# Patient Record
Sex: Male | Born: 1945 | Race: White | Hispanic: No | Marital: Married | Smoking: Never smoker
Health system: Southern US, Community
[De-identification: ages and names within clinical notes are randomized; demographics above are authoritative.]

## PROBLEM LIST (undated history)

## (undated) DIAGNOSIS — I10 Essential (primary) hypertension: Secondary | ICD-10-CM

## (undated) DIAGNOSIS — N289 Disorder of kidney and ureter, unspecified: Secondary | ICD-10-CM

## (undated) DIAGNOSIS — E119 Type 2 diabetes mellitus without complications: Secondary | ICD-10-CM

## (undated) HISTORY — PX: NECK SURGERY: SHX720

## (undated) HISTORY — PX: KNEE SURGERY: SHX244

## (undated) HISTORY — PX: HERNIA REPAIR: SHX51

---

## 2016-10-21 ENCOUNTER — Encounter (HOSPITAL_COMMUNITY): Payer: Self-pay | Admitting: Emergency Medicine

## 2016-10-21 ENCOUNTER — Emergency Department (HOSPITAL_COMMUNITY): Payer: Managed Care, Other (non HMO)

## 2016-10-21 ENCOUNTER — Emergency Department (HOSPITAL_COMMUNITY)
Admission: EM | Admit: 2016-10-21 | Discharge: 2016-10-21 | Disposition: A | Discharge status: Home / Self Care | Payer: Managed Care, Other (non HMO) | Attending: Emergency Medicine | Admitting: Emergency Medicine

## 2016-10-21 DIAGNOSIS — E119 Type 2 diabetes mellitus without complications: Secondary | ICD-10-CM | POA: Diagnosis not present

## 2016-10-21 DIAGNOSIS — Z7984 Long term (current) use of oral hypoglycemic drugs: Secondary | ICD-10-CM | POA: Insufficient documentation

## 2016-10-21 DIAGNOSIS — N200 Calculus of kidney: Secondary | ICD-10-CM

## 2016-10-21 DIAGNOSIS — I10 Essential (primary) hypertension: Secondary | ICD-10-CM | POA: Insufficient documentation

## 2016-10-21 DIAGNOSIS — R109 Unspecified abdominal pain: Secondary | ICD-10-CM | POA: Diagnosis present

## 2016-10-21 HISTORY — DX: Type 2 diabetes mellitus without complications: E11.9

## 2016-10-21 HISTORY — DX: Essential (primary) hypertension: I10

## 2016-10-21 HISTORY — DX: Disorder of kidney and ureter, unspecified: N28.9

## 2016-10-21 LAB — URINALYSIS, ROUTINE W REFLEX MICROSCOPIC
BILIRUBIN URINE: NEGATIVE
Ketones, ur: 20 mg/dL — AB
Leukocytes, UA: NEGATIVE
Nitrite: NEGATIVE
PH: 5 (ref 5.0–8.0)
Protein, ur: 100 mg/dL — AB
SPECIFIC GRAVITY, URINE: 1.021 (ref 1.005–1.030)

## 2016-10-21 LAB — COMPREHENSIVE METABOLIC PANEL
ALK PHOS: 67 U/L (ref 38–126)
ALT: 60 U/L (ref 17–63)
ANION GAP: 18 — AB (ref 5–15)
AST: 57 U/L — ABNORMAL HIGH (ref 15–41)
Albumin: 5 g/dL (ref 3.5–5.0)
BUN: 17 mg/dL (ref 6–20)
CALCIUM: 9.5 mg/dL (ref 8.9–10.3)
CO2: 19 mmol/L — AB (ref 22–32)
Chloride: 96 mmol/L — ABNORMAL LOW (ref 101–111)
Creatinine, Ser: 1.32 mg/dL — ABNORMAL HIGH (ref 0.61–1.24)
GFR calc Af Amer: >60 mL/min (ref >60)
GFR calc non Af Amer: 53 mL/min — ABNORMAL LOW (ref >60)
GLUCOSE: 281 mg/dL — AB (ref 65–99)
Potassium: 4.6 mmol/L (ref 3.5–5.1)
SODIUM: 133 mmol/L — AB (ref 135–145)
Total Bilirubin: 1.1 mg/dL (ref 0.3–1.2)
Total Protein: 8.5 g/dL — ABNORMAL HIGH (ref 6.5–8.1)

## 2016-10-21 LAB — CBC
HCT: 48.1 % (ref 39.0–52.0)
HEMOGLOBIN: 16.8 g/dL (ref 13.0–17.0)
MCH: 32.9 pg (ref 26.0–34.0)
MCHC: 34.9 g/dL (ref 30.0–36.0)
MCV: 94.1 fL (ref 78.0–100.0)
Platelets: 234 10*3/uL (ref 150–400)
RBC: 5.11 MIL/uL (ref 4.22–5.81)
RDW: 14 % (ref 11.5–15.5)
WBC: 13 10*3/uL — ABNORMAL HIGH (ref 4.0–10.5)

## 2016-10-21 LAB — LIPASE, BLOOD: Lipase: 18 U/L (ref 11–51)

## 2016-10-21 MED ORDER — ONDANSETRON HCL 4 MG/2ML IJ SOLN
4.0000 mg | Freq: Once | INTRAMUSCULAR | Status: AC
  Administered 2016-10-21: 4 mg via INTRAVENOUS
  Filled 2016-10-21: qty 2

## 2016-10-21 MED ORDER — OXYCODONE-ACETAMINOPHEN 5-325 MG PO TABS: 2.0000 | ORAL_TABLET | ORAL | 0 refills | Status: AC | PRN

## 2016-10-21 MED ORDER — KETOROLAC TROMETHAMINE 30 MG/ML IJ SOLN
30.0000 mg | Freq: Once | INTRAMUSCULAR | Status: AC
  Administered 2016-10-21: 30 mg via INTRAVENOUS
  Filled 2016-10-21: qty 1

## 2016-10-21 MED ORDER — ONDANSETRON HCL 4 MG PO TABS: 4.0000 mg | ORAL_TABLET | Freq: Four times a day (QID) | ORAL | 0 refills | Status: AC

## 2016-10-21 MED ORDER — TAMSULOSIN HCL 0.4 MG PO CAPS: 0.4000 mg | ORAL_CAPSULE | Freq: Every day | ORAL | 0 refills | Status: AC

## 2016-10-21 MED ORDER — SODIUM CHLORIDE 0.9 % IV BOLUS (SEPSIS)
1000.0000 mL | Freq: Once | INTRAVENOUS | Status: AC
  Administered 2016-10-21: 1000 mL via INTRAVENOUS

## 2016-10-21 MED ORDER — HYDROMORPHONE HCL 1 MG/ML IJ SOLN
1.0000 mg | Freq: Once | INTRAMUSCULAR | Status: AC
  Administered 2016-10-21: 1 mg via INTRAVENOUS
  Filled 2016-10-21: qty 1

## 2016-10-21 NOTE — Discharge Instructions (Signed)
Please take 1 capsule of Flomax by mouth 30 minutes after same meal every day. Please take 2 tablets of Percocet every 4-6 hours as needed for pain. Take one tablet of Zofran every 6 hours as needed for nausea and vomiting. Take plenty of fluids and referred to attached sheet or dietary recommendations.  Get help right away if: You have a fever or chills. You develop severe pain. You develop new abdominal pain. You faint. You are unable to urinate.

## 2016-10-21 NOTE — ED Notes (Signed)
Pt cannot use restroom at this time, aware urine specimen is needed.  

## 2016-10-21 NOTE — ED Provider Notes (Signed)
Medical screening examination/treatment/procedure(s) were conducted as a shared visit with non-physician practitioner(s) and myself.  I personally evaluated the patient during the encounter.   EKG Interpretation None     70 year old male here with left-sided flank pain that began several hours prior to arrival. Patient had abdominal CT scan which was consistent with kidney stone. Was medicated him feel better. He'll follow-up with his neurologist back home   Lorre NickAnthony Ahmari Duerson, MD 10/21/16 1322

## 2016-10-21 NOTE — ED Provider Notes (Signed)
[facility] DEPT Provider Note   CSN: [id number] Arrival date & time: 10/21/16  1040     History   Chief Complaint Chief Complaint  Patient presents with  . Flank Pain    left    HPI Benjamin Spencer is a 70 y.o. male with history of diabetes, hypertension and 1 episode of kidney stone presents to the ED complaining of left flank pain that radiates to starting this morning at 2 AM. She characterizes her pain as rapid onset, sharp, continuous, 10/10 pain. He reports not taking any pain medication since pain started. He states nothing makes it better. He reports to feeling like the previous kidney stone he had several years ago. He reports nausea and 5 episodes of vomiting since started. He denies hematemesis. He states he hasn't had anything to eat or drink since pain started. He denies chest pain, shortness of breath, dysuria, any changes in urinary symptoms, changes in bowel movements, fever, or chills.   HPI  Past Medical History:  Diagnosis Date  . Diabetes mellitus without complication ([place])   . Hypertension   . Renal disorder    kidney stones    There are no active problems to display for this patient.   Past Surgical History:  Procedure Laterality Date  . HERNIA REPAIR    . KNEE SURGERY     Right  . NECK SURGERY         Home Medications    Prior to Admission medications   Medication Sig Start Date End Date Taking? Authorizing Provider  amLODipine (NORVASC) 10 MG tablet Take 10 mg by mouth daily.   Yes Historical Provider, MD  Cholecalciferol (VITAMIN D3) 5000 units TABS 5000 units by mouth daily 05/30/16  Yes Historical Provider, MD  econazole nitrate 1 % cream APPLY TO AFFECTED AREA EVERY DAY FOR 1 MONTH, THEN AS NEEDED 10/06/16  Yes Historical Provider, MD  gabapentin (NEURONTIN) 300 MG capsule  10/06/16  Yes Historical Provider, MD  ibuprofen (ADVIL,MOTRIN) 800 MG tablet Take 800 mg by mouth 2 (two) times daily as needed for mild pain.    Yes Historical  Provider, MD  losartan (COZAAR) 100 MG tablet Take 100 mg by mouth daily.   Yes Historical Provider, MD  metFORMIN (GLUCOPHAGE) 500 MG tablet Take 500 mg by mouth 2 (two) times daily with a meal.   Yes Historical Provider, MD  Multiple Vitamin (MULTIVITAMIN WITH MINERALS) TABS tablet Take 1 tablet by mouth daily.   Yes Historical Provider, MD  omeprazole (PRILOSEC) 20 MG capsule Take 20 mg by mouth daily.   Yes Historical Provider, MD  pravastatin (PRAVACHOL) 40 MG tablet Take 40 mg by mouth daily.   Yes Historical Provider, MD  Testosterone Cypionate 200 MG/ML KIT Inject 200 mg into the muscle every 14 (fourteen) days.   Yes Historical Provider, MD  ondansetron (ZOFRAN) 4 MG tablet Take 1 tablet (4 mg total) by mouth every 6 (six) hours. 10/21/16   Quandra Fedorchak [place], [place]  oxyCODONE-acetaminophen (PERCOCET/ROXICET) 5-325 MG tablet Take 2 tablets by mouth every 4 (four) hours as needed for severe pain. 10/21/16   Saleema Weppler Manuel [place], [place]  tamsulosin (FLOMAX) 0.4 MG CAPS capsule Take 1 capsule (0.4 mg total) by mouth daily after breakfast. 10/21/16   Bettey [place], [place]    Family History No family history on file.  Social History Social History  Substance Use Topics  . Smoking status: Never Smoker  . Smokeless tobacco: Never Used  . Alcohol use No  Allergies   Percocet [oxycodone-acetaminophen]   Review of Systems Review of Systems  Constitutional: Positive for appetite change. Negative for chills and fever.  HENT: Negative for sore throat.   Eyes: Negative for visual disturbance.  Respiratory: Negative for cough and shortness of breath.   Cardiovascular: Negative for chest pain and palpitations.  Gastrointestinal: Positive for abdominal pain, nausea and vomiting. Negative for constipation and diarrhea.  Genitourinary: Positive for flank pain. Negative for dysuria and hematuria.  Musculoskeletal: Positive for back pain. Negative for arthralgias.  Skin: Negative for  color change and rash.  All other systems reviewed and are negative.    Physical Exam Updated Vital Signs BP 149/87   Pulse 104   Temp 97.5 F (36.4 C) (Oral)   Resp 18   Ht _0  (1.702 m)   Wt 114.3 kg   SpO2 94%   BMI 39.47 kg/m   Physical Exam  Constitutional: He is oriented to person, place, and time. He appears well-developed and well-nourished.  HENT:  Head: Normocephalic and atraumatic.  Nose: Nose normal.  Eyes: Conjunctivae and EOM are normal. Pupils are equal, round, and reactive to light.  Neck: Normal range of motion. Neck supple.  Cardiovascular: Normal rate and normal heart sounds.   Pulmonary/Chest: Effort normal and breath sounds normal. No respiratory distress. He exhibits no tenderness.  Abdominal: Soft. Bowel sounds are normal. He exhibits distension. There is tenderness. There is no rebound and no guarding.  Musculoskeletal: Normal range of motion.  Neurological: He is alert and oriented to person, place, and time.  Skin: Skin is warm. Capillary refill takes less than 2 seconds.  Psychiatric: He has a normal mood and affect. His behavior is normal.  Nursing note and vitals reviewed.    ED Treatments / Results  Labs (all labs ordered are listed, but only abnormal results are displayed) Labs Reviewed  COMPREHENSIVE METABOLIC PANEL - Abnormal; Notable for the following:       Result Value   Sodium 133 (*)    Chloride 96 (*)    CO2 19 (*)    Glucose, Bld 281 (*)    Creatinine, Ser 1.32 (*)    Total Protein 8.5 (*)    AST 57 (*)    GFR calc non Af Amer 53 (*)    Anion gap 18 (*)    All other components within normal limits  CBC - Abnormal; Notable for the following:    WBC 13.0 (*)    All other components within normal limits  URINALYSIS, ROUTINE W REFLEX MICROSCOPIC - Abnormal; Notable for the following:    Color, Urine STRAW (*)    Glucose, UA >=500 (*)    Hgb urine dipstick MODERATE (*)    Ketones, ur 20 (*)    Protein, ur 100 (*)    All  other components within normal limits  LIPASE, BLOOD    EKG  EKG Interpretation None       Radiology Ct Abdomen Pelvis Wo Contrast  Result Date: 10/21/2016 CLINICAL DATA:  Acute left flank pain, history of nephrolithiasis EXAM: CT ABDOMEN AND PELVIS WITHOUT CONTRAST TECHNIQUE: Multidetector CT imaging of the abdomen and pelvis was performed following the standard protocol without IV contrast. COMPARISON:  None available FINDINGS: Lower chest: Dependent bibasilar atelectasis. Normal heart size. No pericardial or pleural effusion. Lower thoracic degenerative changes. Hepatobiliary: Diffuse hypoattenuation of the liver parenchyma compatible with hepatic steatosis. No biliary dilatation or obstruction. Gallbladder is collapsed. No definite focal hepatic abnormality by noncontrast  imaging. Pancreas: Unremarkable. No pancreatic ductal dilatation or surrounding inflammatory changes. Spleen: Normal in size without focal abnormality. Adrenals/Urinary Tract: Normal adrenal glands. Left kidney demonstrates mild hydronephrosis and associated hydroureter with surrounding perinephric and periureteral stranding edema. This is secondary to 2 left distal ureteral mildly obstructing calculi, 1 measuring 4 mm and a second measuring 2 mm, images 82 and 83. Right kidney and ureter demonstrate no acute obstruction, hydronephrosis or obstructing ureteral calculi. Both kidneys demonstrate residual punctate subcentimeter intrarenal calculi bilaterally. Stomach/Bowel: Negative for obstruction, significant dilatation, ileus, or free air. Normal appendix demonstrated. Scattered colonic diverticulosis without acute inflammatory process. Colon is underdistended. Vascular/Lymphatic: Aortic atherosclerosis evident without aneurysm. No adenopathy. Reproductive: Prostate calcifications noted. Prostate and seminal vesicles unremarkable. Other: No abdominal wall hernia or abnormality. No abdominopelvic ascites. Musculoskeletal:  Degenerative changes of the facets at L4-5 and L5-S1 with grade 1 anterolisthesis of L4 upon L5. No acute osseous finding or compression fracture. No pars defects. No acute osseous finding. IMPRESSION: Two adjacent left distal ureteral mildly obstructing calculi, 1 measuring 4 mm and a second measuring 2 mm resulting in mild left hydroureteronephrosis. Additional punctate nonobstructing intrarenal calculi bilaterally. Bibasilar atelectasis Hepatic steatosis Minor colonic diverticulosis Aortic atherosclerosis without aneurysm Electronically Signed   By: Jerilynn Mages.  Shick M.D.   On: 10/21/2016 12:27    Procedures Procedures (including critical care time)  Medications Ordered in ED Medications  ketorolac (TORADOL) 30 MG/ML injection 30 mg (30 mg Intravenous Given 10/21/16 1149)  ondansetron (ZOFRAN) injection 4 mg (4 mg Intravenous Given 10/21/16 1148)  sodium chloride 0.9 % bolus 1,000 mL (0 mLs Intravenous Stopped 10/21/16 1336)  HYDROmorphone (DILAUDID) injection 1 mg (1 mg Intravenous Given 10/21/16 1337)     Initial Impression / Assessment and Plan / ED Course  I have reviewed the triage vital signs and the nursing notes.  Pertinent labs & imaging results that were available during my care of the patient were reviewed by me and considered in my medical decision making (see chart for details).  Clinical Course   Patient is a 70 year old male presenting with left flank pain since 2 AM. History of kidney stone. On exam afebrile. HR elevated, BP elevated.  heart and lung sounds are clear. Patient's left flank TTP. No other findings on abdominal exam. Negative Murphy sign. No focal tenderness at McBurney's point. Pain and nausea/vomiting managed in ED. Given fluids. CT shows 2 stones, one measuring 4 mm and a second measuring 2 mm resulting in mild hydroureteronephrosis.  Opon reevaluation patient's vital signs improving. Patient is NAD, afebrile. Patient states his pain is better and now. Patient will be  sent home with Flomax and pain medication and told to drink plenty of fluids with a proper dietary recommendation.  Patient understood. Patient agrees with assessment and plan. Patient told to follow up with PCP on Monday for follow-up. Return precautions given for any new or worsening symptoms such as chills, fever, and inability to urinate.  Final Clinical Impressions(s) / ED Diagnoses   Final diagnoses:  Nephrolithiasis    New Prescriptions Discharge Medication List as of 10/21/2016  1:30 PM    START taking these medications   Details  ondansetron (ZOFRAN) 4 MG tablet Take 1 tablet (4 mg total) by mouth every 6 (six) hours., Starting Sat 10/21/2016, Print    oxyCODONE-acetaminophen (PERCOCET/ROXICET) 5-325 MG tablet Take 2 tablets by mouth every 4 (four) hours as needed for severe pain., Starting Sat 10/21/2016, Print    tamsulosin (FLOMAX) 0.4 MG CAPS capsule Take  1 capsule (0.4 mg total) by mouth daily after breakfast., Starting Sat 10/21/2016, [place], [place] 10/21/16 2348

## 2016-10-21 NOTE — ED Triage Notes (Signed)
Patient complaining of left flank pain that radiates to left/cental lower abdominal pain starting this morning at 2am. Denies dysuria or urinary frequency. Reports nausea/vomiting. Patient had diarrhea at home, however patient reports that his home medications cause this normally. Hx of kidney stone.

## 2016-10-21 NOTE — ED Notes (Signed)
Bed: WA03 Expected date:  Expected time:  Means of arrival:  Comments: 

## 2017-10-09 IMAGING — CT CT ABD-PELV W/O CM
2 of 3 series · 16 of 46 positions shown, 18 images · non-contrast
Comparison: None available

CLINICAL DATA: Acute left flank pain, history of nephrolithiasis

EXAM:
CT ABDOMEN AND PELVIS WITHOUT CONTRAST
TECHNIQUE: Multidetector CT imaging of the abdomen and pelvis was performed
following the standard protocol without IV contrast.

[Series 3: coronal · coronal · 0.96mm/px · 3 of 174 slices shown]
[im 58/174  soft-tissue]
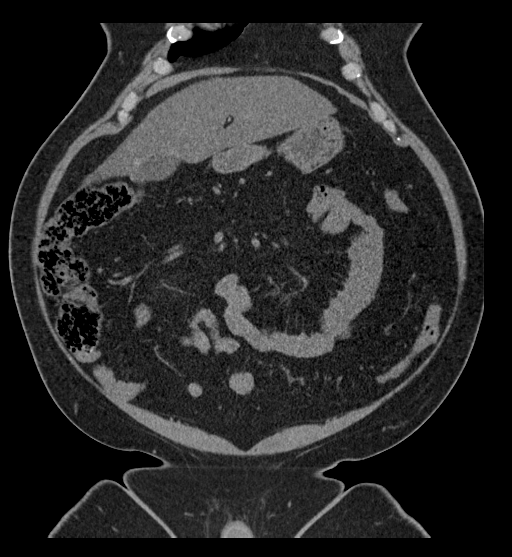
[im 77/174  soft-tissue]
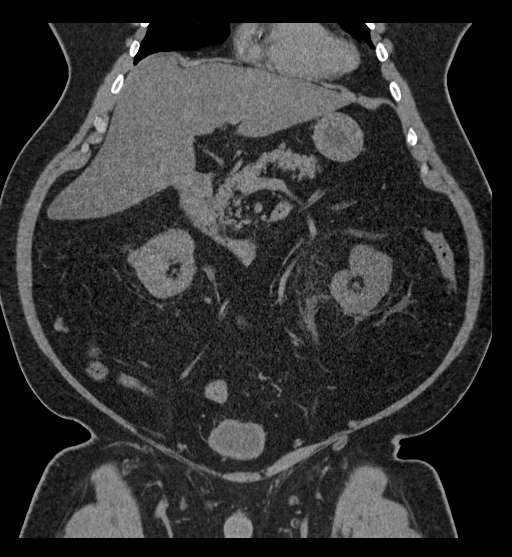
[im 97/174  soft-tissue]
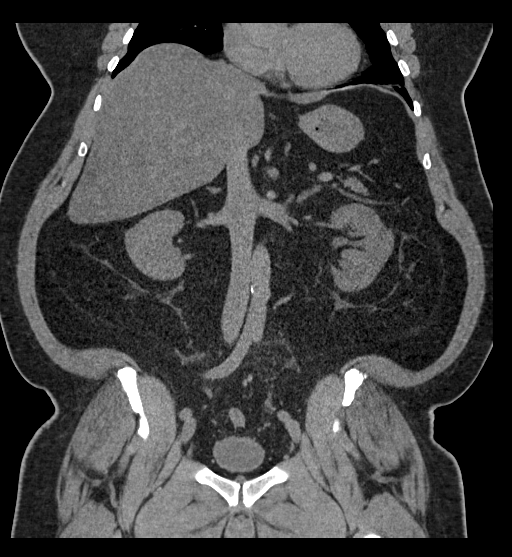

[Series 6: lung · axial · 0.98mm/px · z∈[+126,+288]mm · 13 of 94 slices shown, 15 images]
[im 7/94  soft-tissue]
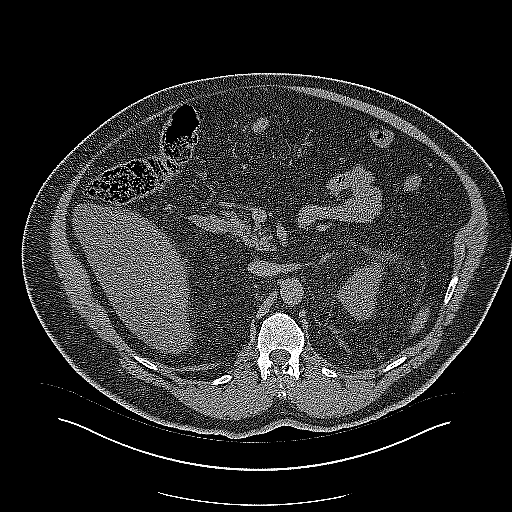
[im 7/94  bone]
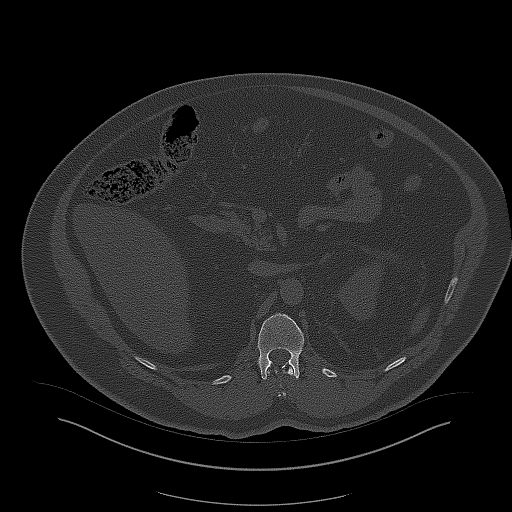
[im 13/94  soft-tissue]
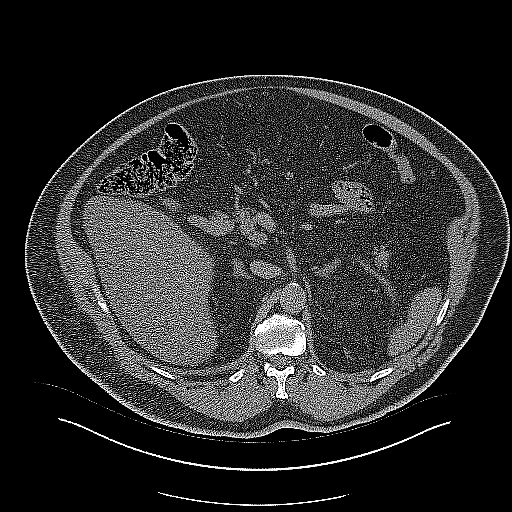
[im 19/94  soft-tissue]
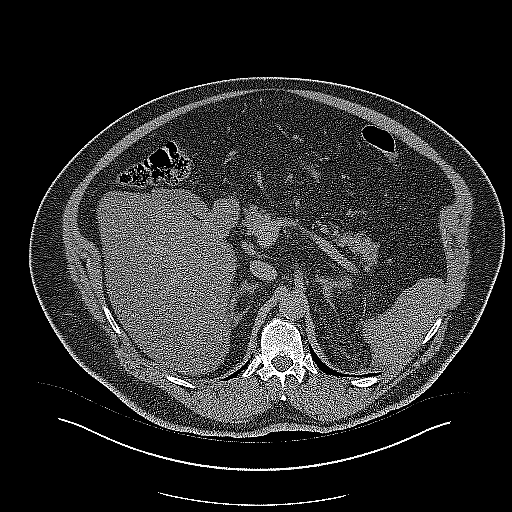
[im 28/94  soft-tissue]
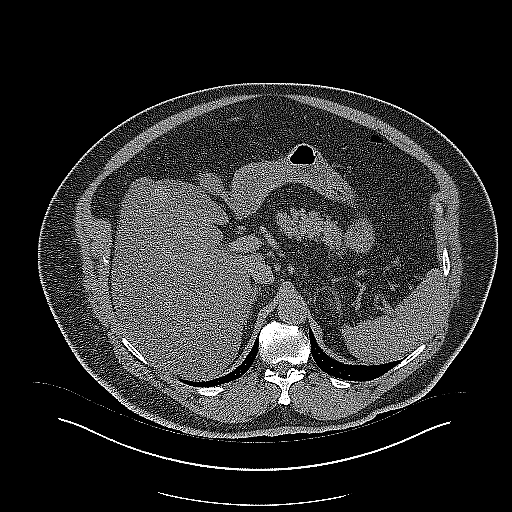
[im 34/94  soft-tissue]
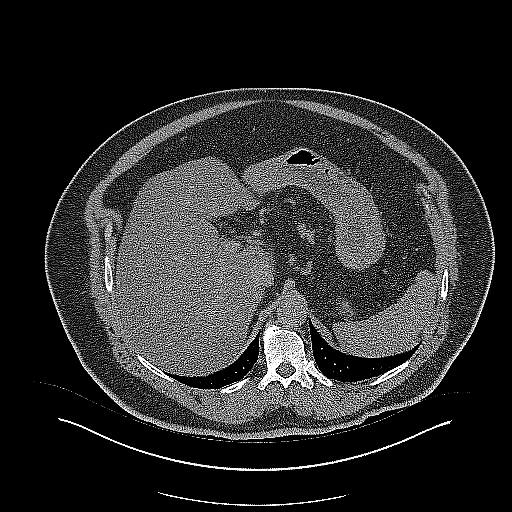
[im 40/94  soft-tissue]
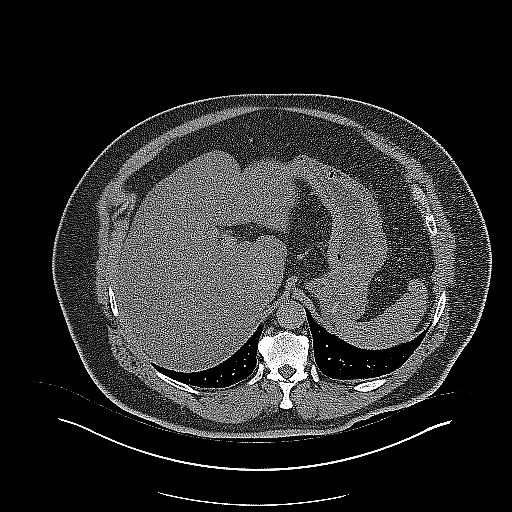
[im 49/94  soft-tissue]
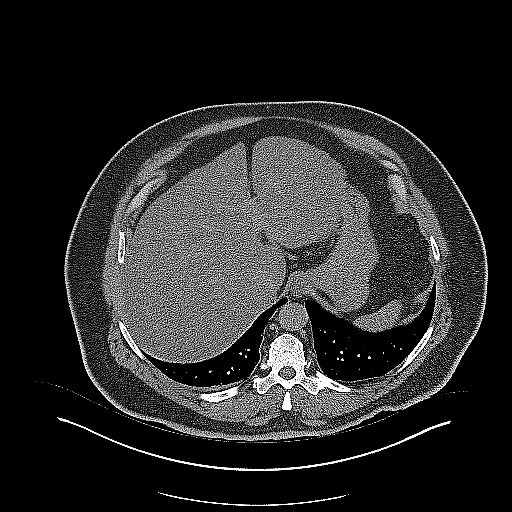
[im 55/94  soft-tissue]
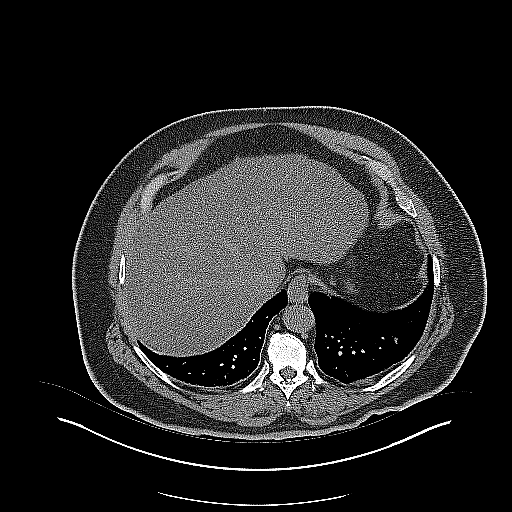
[im 61/94  soft-tissue]
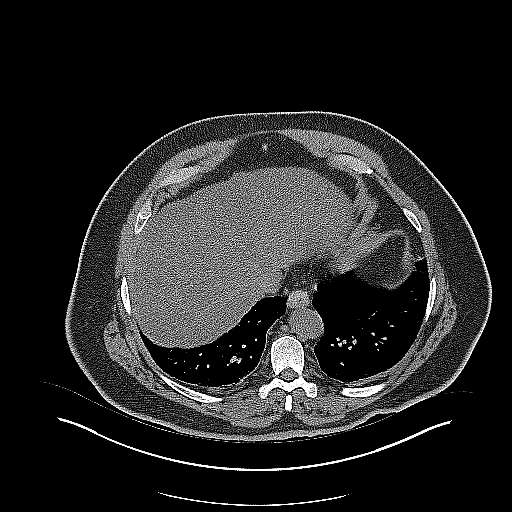
[im 61/94  bone]
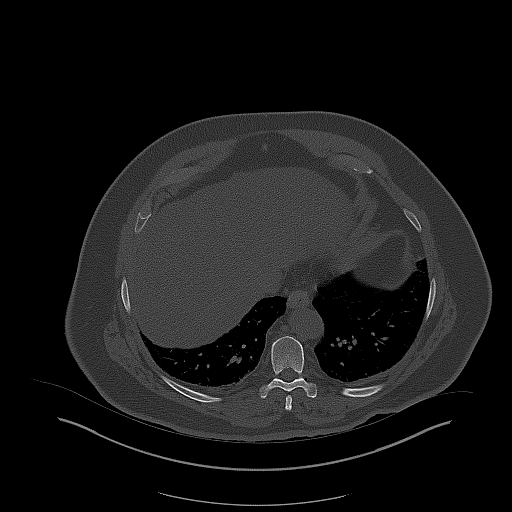
[im 67/94  soft-tissue]
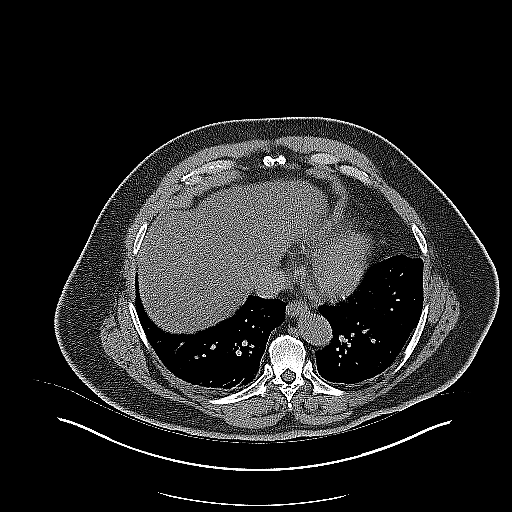
[im 76/94  soft-tissue]
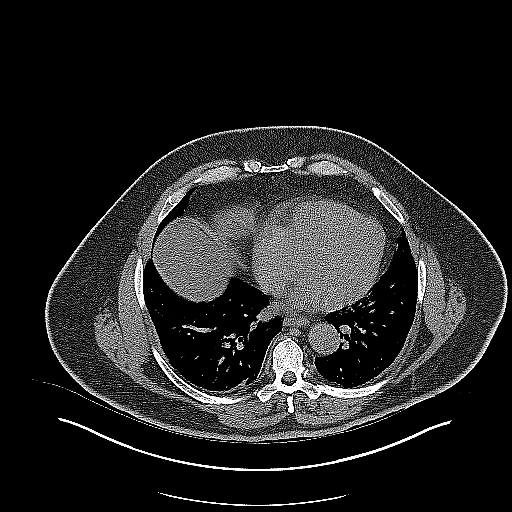
[im 82/94  soft-tissue]
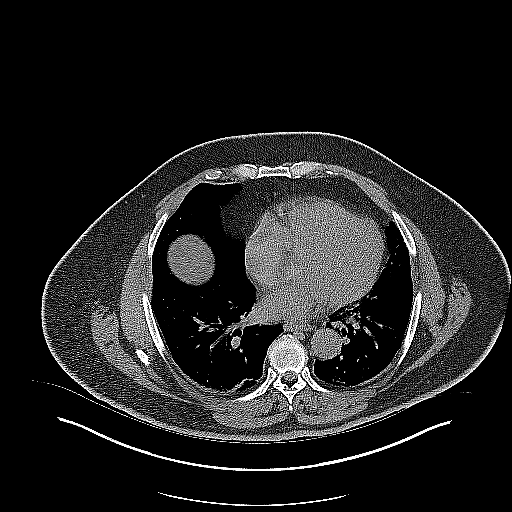
[im 88/94  soft-tissue]
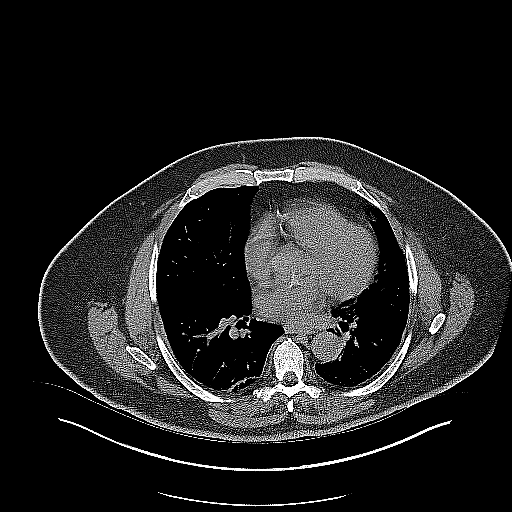

[16 of 46 positions shown; findings below may reference images not displayed]

FINDINGS: Lower chest: Dependent bibasilar atelectasis. Normal heart size. No
pericardial or pleural effusion. Lower thoracic degenerative
changes.

Hepatobiliary: Diffuse hypoattenuation of the liver parenchyma
compatible with hepatic steatosis. No biliary dilatation or
obstruction. Gallbladder is collapsed. No definite focal hepatic
abnormality by noncontrast imaging.

Pancreas: Unremarkable. No pancreatic ductal dilatation or
surrounding inflammatory changes.

Spleen: Normal in size without focal abnormality.

Adrenals/Urinary Tract: Normal adrenal glands.

Left kidney demonstrates mild hydronephrosis and associated
hydroureter with surrounding perinephric and periureteral stranding
edema. This is secondary to 2 left distal ureteral mildly
obstructing calculi, 1 measuring 4 mm and a second measuring 2 mm,
images 82 and 83.

Right kidney and ureter demonstrate no acute obstruction,
hydronephrosis or obstructing ureteral calculi.

Both kidneys demonstrate residual punctate subcentimeter intrarenal
calculi bilaterally.

Stomach/Bowel: Negative for obstruction, significant dilatation,
ileus, or free air. Normal appendix demonstrated. Scattered colonic
diverticulosis without acute inflammatory process. Colon is
underdistended.

Vascular/Lymphatic: Aortic atherosclerosis evident without aneurysm.
No adenopathy.

Reproductive: Prostate calcifications noted. Prostate and seminal
vesicles unremarkable.

Other: No abdominal wall hernia or abnormality. No abdominopelvic
ascites.

Musculoskeletal: Degenerative changes of the facets at L4-5 and
L5-S1 with grade 1 anterolisthesis of L4 upon L5. No acute osseous
finding or compression fracture. No pars defects. No acute osseous
finding.
IMPRESSION: Two adjacent left distal ureteral mildly obstructing calculi, 1
measuring 4 mm and a second measuring 2 mm resulting in mild left
hydroureteronephrosis.

Additional punctate nonobstructing intrarenal calculi bilaterally.

Bibasilar atelectasis

Hepatic steatosis

Minor colonic diverticulosis

Aortic atherosclerosis without aneurysm
